# Patient Record
Sex: Male | Born: 1975 | Race: White | Hispanic: No | Marital: Married | Smoking: Never smoker
Health system: Southern US, Community
[De-identification: ages and names within clinical notes are randomized; demographics above are authoritative.]

## PROBLEM LIST (undated history)

## (undated) DIAGNOSIS — E7800 Pure hypercholesterolemia, unspecified: Secondary | ICD-10-CM

## (undated) DIAGNOSIS — M1990 Unspecified osteoarthritis, unspecified site: Secondary | ICD-10-CM

## (undated) DIAGNOSIS — G4730 Sleep apnea, unspecified: Secondary | ICD-10-CM

## (undated) DIAGNOSIS — N200 Calculus of kidney: Secondary | ICD-10-CM

---

## 1999-04-18 ENCOUNTER — Encounter: Payer: Self-pay | Admitting: Sports Medicine

## 1999-04-18 ENCOUNTER — Encounter: Admission: RE | Admit: 1999-04-18 | Discharge: 1999-04-18 | Payer: Self-pay | Admitting: Sports Medicine

## 2007-03-20 HISTORY — PX: KNEE SURGERY: SHX244

## 2007-11-13 ENCOUNTER — Emergency Department (HOSPITAL_BASED_OUTPATIENT_CLINIC_OR_DEPARTMENT_OTHER): Admission: EM | Admit: 2007-11-13 | Discharge: 2007-11-13 | Payer: Self-pay | Admitting: Emergency Medicine

## 2012-09-19 ENCOUNTER — Encounter (HOSPITAL_COMMUNITY): Payer: Self-pay | Admitting: Emergency Medicine

## 2012-09-19 ENCOUNTER — Emergency Department (HOSPITAL_COMMUNITY): Payer: BC Managed Care – PPO

## 2012-09-19 ENCOUNTER — Emergency Department (HOSPITAL_COMMUNITY)
Admission: EM | Admit: 2012-09-19 | Discharge: 2012-09-20 | Disposition: A | Discharge status: Home / Self Care | Payer: BC Managed Care – PPO | Attending: Emergency Medicine | Admitting: Emergency Medicine

## 2012-09-19 DIAGNOSIS — N200 Calculus of kidney: Secondary | ICD-10-CM | POA: Insufficient documentation

## 2012-09-19 DIAGNOSIS — E7800 Pure hypercholesterolemia, unspecified: Secondary | ICD-10-CM | POA: Insufficient documentation

## 2012-09-19 HISTORY — DX: Pure hypercholesterolemia, unspecified: E78.00

## 2012-09-19 LAB — COMPREHENSIVE METABOLIC PANEL
Alkaline Phosphatase: 89 U/L (ref 39–117)
BUN: 16 mg/dL (ref 6–23)
CO2: 25 mEq/L (ref 19–32)
Chloride: 100 mEq/L (ref 96–112)
GFR calc Af Amer: >90 mL/min (ref >90)
Glucose, Bld: 124 mg/dL — ABNORMAL HIGH (ref 70–99)
Potassium: 4.1 mEq/L (ref 3.5–5.1)
Total Bilirubin: 0.3 mg/dL (ref 0.3–1.2)

## 2012-09-19 LAB — CBC WITH DIFFERENTIAL/PLATELET
Hemoglobin: 15.9 g/dL (ref 13.0–17.0)
Lymphs Abs: 3.5 10*3/uL (ref 0.7–4.0)
MCH: 29.6 pg (ref 26.0–34.0)
Monocytes Relative: 9 % (ref 3–12)
Neutro Abs: 13 10*3/uL — ABNORMAL HIGH (ref 1.7–7.7)
Neutrophils Relative %: 71 % (ref 43–77)
RBC: 5.38 MIL/uL (ref 4.22–5.81)

## 2012-09-19 MED ORDER — ONDANSETRON HCL 4 MG/2ML IJ SOLN
4.0000 mg | Freq: Once | INTRAMUSCULAR | Status: AC
  Administered 2012-09-19: 4 mg via INTRAVENOUS
  Filled 2012-09-19: qty 2

## 2012-09-19 MED ORDER — HYDROMORPHONE HCL PF 1 MG/ML IJ SOLN
1.0000 mg | Freq: Once | INTRAMUSCULAR | Status: AC
  Administered 2012-09-19: 1 mg via INTRAVENOUS
  Filled 2012-09-19: qty 1

## 2012-09-19 MED ORDER — HYDROMORPHONE HCL PF 2 MG/ML IJ SOLN: 2.0000 mg | Freq: Once | INTRAMUSCULAR | Status: DC

## 2012-09-19 MED ORDER — TAMSULOSIN HCL 0.4 MG PO CAPS
0.8000 mg | ORAL_CAPSULE | Freq: Once | ORAL | Status: AC
  Administered 2012-09-20: 0.8 mg via ORAL
  Filled 2012-09-19: qty 2

## 2012-09-19 MED ORDER — SODIUM CHLORIDE 0.9 % IV SOLN
Freq: Once | INTRAVENOUS | Status: AC
Start: 2012-09-19 — End: 2012-09-19
  Administered 2012-09-19: 75 mL/h via INTRAVENOUS

## 2012-09-19 MED ORDER — KETOROLAC TROMETHAMINE 30 MG/ML IJ SOLN
30.0000 mg | Freq: Once | INTRAMUSCULAR | Status: AC
  Administered 2012-09-19: 30 mg via INTRAVENOUS
  Filled 2012-09-19: qty 1

## 2012-09-19 NOTE — ED Notes (Signed)
Patient transported to CT 

## 2012-09-19 NOTE — ED Notes (Addendum)
Pt reports having 10/10 right sided flank pain that began about two hours ago. Pt reports nausea, emesis, and is diaphoretic. Pt denies urinary frequency, dysuria, or hematuria. Pt denies any abdominal surgery.

## 2012-09-19 NOTE — ED Provider Notes (Signed)
   History    CSN: [id number] Arrival date & time 09/19/12  2212  First MD Initiated Contact with Patient 09/19/12 2220     Chief Complaint  Patient presents with  . Flank Pain   (Consider location/radiation/quality/duration/timing/severity/associated sxs/prior Treatment) The history is provided by the patient and the spouse. No language interpreter was used.    Dillon Taylor is a(n) 37 y.o. male who presents with cc R flank pain.Began acutely 2 hours ago. Sudden onset, radiates to the R testicle, sharp, intermittent, severe, associated nausea without vomiting.  Denies dysuria, suprapubic pain, frequency, urgency, or hematuria. Denies history of kidney stone.  He has no known family history.  He does have a past history of hypercholesterolemia patient is an obese white male.  Denies fevers, chills, myalgias, arthralgias. Denies DOE, SOB, chest tightness or pressure, radiation to left arm, jaw or back, or diaphoresis.  Denies headaches, light headedness, weakness, visual disturbances.     Past Medical History  Diagnosis Date  . Hypercholesteremia    Past Surgical History  Procedure Laterality Date  . Knee surgery     No family history on file. History  Substance Use Topics  . Smoking status: Never Smoker   . Smokeless tobacco: Never Used  . Alcohol Use: Yes     Comment: Socially     Review of Systems Ten systems reviewed and are negative for acute change, except as noted in the HPI.   Allergies  Review of patient's allergies indicates no known allergies.  Home Medications  No current outpatient prescriptions on file. BP 153/106  Pulse 93  Temp(Src) 98.5 F (36.9 C) (Oral)  Resp 24  Ht 5\' 11"  (1.803 m)  Wt 240 lb (108.863 kg)  BMI 33.49 kg/m2  SpO2 95% Physical Exam Physical Exam  Nursing note and vitals reviewed. Constitutional: Patient's skin is flushed.  He he is riding on the bed.  He appears extremely uncomfortable. HENT:  Head: Normocephalic and  atraumatic.  Eyes: Conjunctivae normal are normal. No scleral icterus.  Neck: Normal range of motion. Neck supple.  Cardiovascular: Normal rate, regular rhythm and normal heart sounds.   Pulmonary/Chest: Effort normal and breath sounds normal. No respiratory distress.  Abdominal: Soft.   CVA  tenderness right side no guarding no rebound.   Musculoskeletal: He exhibits no edema.  Neurological: He is alert.  Skin: Skin is warm and dry. Psychiatric: His behavior is normal.    ED Course  Procedures (including critical care time) Labs Reviewed  CBC WITH DIFFERENTIAL - Abnormal; Notable for the following:    WBC 18.4 (*)    Neutro Abs 13.0 (*)    Monocytes Absolute 1.6 (*)    All other components within normal limits  COMPREHENSIVE METABOLIC PANEL - Abnormal; Notable for the following:    Glucose, Bld 124 (*)    GFR calc non Af Amer 80 (*)    All other components within normal limits  URINALYSIS, ROUTINE W REFLEX MICROSCOPIC   No results found. 1. Nephrolithiasis     MDM   12:55 AM Patient has been given 4 mg dilaudid and 30 mg Toradol. Still in severe pain.  The patient's pain is still uncontrolled.  Urine is still pending and elevated white count.  I have given report to PA Pisciotta who will assume care of the patient. May have associated infection. Patient may need admission for pain control.  Arthor Captain, PA-C 09/20/12 1348  Arthor Captain, PA-C 09/24/12 2051

## 2012-09-20 LAB — URINALYSIS, ROUTINE W REFLEX MICROSCOPIC
Ketones, ur: NEGATIVE mg/dL
Leukocytes, UA: NEGATIVE
Nitrite: NEGATIVE
Protein, ur: NEGATIVE mg/dL

## 2012-09-20 MED ORDER — ONDANSETRON HCL 4 MG PO TABS: 4.0000 mg | ORAL_TABLET | Freq: Three times a day (TID) | ORAL | Status: DC | PRN

## 2012-09-20 MED ORDER — HYDROMORPHONE HCL PF 1 MG/ML IJ SOLN
1.0000 mg | Freq: Once | INTRAMUSCULAR | Status: AC
  Administered 2012-09-20: 1 mg via INTRAMUSCULAR
  Filled 2012-09-20: qty 1

## 2012-09-20 MED ORDER — TAMSULOSIN HCL 0.4 MG PO CAPS: 0.4000 mg | ORAL_CAPSULE | Freq: Every day | ORAL | Status: DC

## 2012-09-20 MED ORDER — OXYCODONE-ACETAMINOPHEN 5-325 MG PO TABS: ORAL_TABLET | ORAL | Status: DC

## 2012-09-20 MED ORDER — HYDROMORPHONE HCL PF 2 MG/ML IJ SOLN
2.0000 mg | Freq: Once | INTRAMUSCULAR | Status: AC
  Administered 2012-09-20: 2 mg via INTRAVENOUS
  Filled 2012-09-20: qty 1

## 2012-09-22 ENCOUNTER — Encounter (HOSPITAL_BASED_OUTPATIENT_CLINIC_OR_DEPARTMENT_OTHER): Payer: Self-pay | Admitting: Anesthesiology

## 2012-09-22 ENCOUNTER — Ambulatory Visit (HOSPITAL_BASED_OUTPATIENT_CLINIC_OR_DEPARTMENT_OTHER): Payer: BC Managed Care – PPO | Admitting: Anesthesiology

## 2012-09-22 ENCOUNTER — Ambulatory Visit (HOSPITAL_COMMUNITY): Payer: BC Managed Care – PPO

## 2012-09-22 ENCOUNTER — Encounter (HOSPITAL_BASED_OUTPATIENT_CLINIC_OR_DEPARTMENT_OTHER)
Admission: RE | Disposition: A | Discharge status: Home / Self Care | Payer: Self-pay | Source: Ambulatory Visit | Attending: Urology

## 2012-09-22 ENCOUNTER — Other Ambulatory Visit: Payer: Self-pay | Admitting: Urology

## 2012-09-22 ENCOUNTER — Ambulatory Visit (HOSPITAL_BASED_OUTPATIENT_CLINIC_OR_DEPARTMENT_OTHER)
Admission: RE | Admit: 2012-09-22 | Discharge: 2012-09-22 | Disposition: A | Discharge status: Home / Self Care | Payer: BC Managed Care – PPO | Source: Ambulatory Visit | Attending: Urology | Admitting: Urology

## 2012-09-22 DIAGNOSIS — N1330 Unspecified hydronephrosis: Secondary | ICD-10-CM | POA: Insufficient documentation

## 2012-09-22 DIAGNOSIS — R112 Nausea with vomiting, unspecified: Secondary | ICD-10-CM | POA: Insufficient documentation

## 2012-09-22 DIAGNOSIS — N35919 Unspecified urethral stricture, male, unspecified site: Secondary | ICD-10-CM | POA: Insufficient documentation

## 2012-09-22 DIAGNOSIS — N201 Calculus of ureter: Secondary | ICD-10-CM

## 2012-09-22 DIAGNOSIS — E7800 Pure hypercholesterolemia, unspecified: Secondary | ICD-10-CM | POA: Insufficient documentation

## 2012-09-22 DIAGNOSIS — Z79899 Other long term (current) drug therapy: Secondary | ICD-10-CM | POA: Insufficient documentation

## 2012-09-22 HISTORY — PX: CYSTOSCOPY WITH STENT PLACEMENT: SHX5790

## 2012-09-22 HISTORY — DX: Sleep apnea, unspecified: G47.30

## 2012-09-22 SURGERY — CYSTOSCOPY, WITH STENT INSERTION
Anesthesia: General | Site: Ureter | Laterality: Right | Wound class: Clean Contaminated

## 2012-09-22 MED ORDER — CEFAZOLIN SODIUM-DEXTROSE 2-3 GM-% IV SOLR
INTRAVENOUS | Status: DC | PRN
  Administered 2012-09-22: 2 g via INTRAVENOUS

## 2012-09-22 MED ORDER — CEPHALEXIN 500 MG PO CAPS: 500.0000 mg | ORAL_CAPSULE | Freq: Four times a day (QID) | ORAL | Status: DC

## 2012-09-22 MED ORDER — FENTANYL CITRATE 0.05 MG/ML IJ SOLN
25.0000 ug | INTRAMUSCULAR | Status: DC | PRN
  Filled 2012-09-22: qty 1

## 2012-09-22 MED ORDER — STERILE WATER FOR IRRIGATION IR SOLN
Status: DC | PRN
  Administered 2012-09-22: 3000 mL

## 2012-09-22 MED ORDER — PROPOFOL 10 MG/ML IV BOLUS
INTRAVENOUS | Status: DC | PRN
  Administered 2012-09-22: 300 mg via INTRAVENOUS

## 2012-09-22 MED ORDER — PHENAZOPYRIDINE HCL 100 MG PO TABS
100.0000 mg | ORAL_TABLET | Freq: Three times a day (TID) | ORAL | Status: AC
  Administered 2012-09-22: 100 mg via ORAL
  Filled 2012-09-22: qty 1

## 2012-09-22 MED ORDER — OXYCODONE-ACETAMINOPHEN 5-325 MG PO TABS
1.0000 | ORAL_TABLET | ORAL | Status: AC | PRN
  Administered 2012-09-22: 1 via ORAL
  Filled 2012-09-22: qty 1

## 2012-09-22 MED ORDER — LIDOCAINE HCL (CARDIAC) 20 MG/ML IV SOLN
INTRAVENOUS | Status: DC | PRN
  Administered 2012-09-22: 100 mg via INTRAVENOUS

## 2012-09-22 MED ORDER — SENNOSIDES-DOCUSATE SODIUM 8.6-50 MG PO TABS: 1.0000 | ORAL_TABLET | Freq: Two times a day (BID) | ORAL | Status: DC

## 2012-09-22 MED ORDER — PHENAZOPYRIDINE HCL 100 MG PO TABS: 100.0000 mg | ORAL_TABLET | Freq: Three times a day (TID) | ORAL | Status: DC | PRN

## 2012-09-22 MED ORDER — MIDAZOLAM HCL 5 MG/5ML IJ SOLN
INTRAMUSCULAR | Status: DC | PRN
  Administered 2012-09-22: 2 mg via INTRAVENOUS

## 2012-09-22 MED ORDER — ACETAMINOPHEN 10 MG/ML IV SOLN
INTRAVENOUS | Status: DC | PRN
  Administered 2012-09-22: 1000 mg via INTRAVENOUS

## 2012-09-22 MED ORDER — DEXAMETHASONE SODIUM PHOSPHATE 4 MG/ML IJ SOLN
INTRAMUSCULAR | Status: DC | PRN
  Administered 2012-09-22: 10 mg via INTRAVENOUS

## 2012-09-22 MED ORDER — ONDANSETRON HCL 4 MG/2ML IJ SOLN
INTRAMUSCULAR | Status: DC | PRN
  Administered 2012-09-22: 4 mg via INTRAVENOUS

## 2012-09-22 MED ORDER — FENTANYL CITRATE 0.05 MG/ML IJ SOLN
INTRAMUSCULAR | Status: DC | PRN
  Administered 2012-09-22 (×4): 50 ug via INTRAVENOUS

## 2012-09-22 MED ORDER — PROMETHAZINE HCL 25 MG/ML IJ SOLN
6.2500 mg | INTRAMUSCULAR | Status: DC | PRN
  Filled 2012-09-22: qty 1

## 2012-09-22 MED ORDER — HYOSCYAMINE SULFATE 0.125 MG PO TABS: 0.1250 mg | ORAL_TABLET | ORAL | Status: DC | PRN

## 2012-09-22 MED ORDER — LACTATED RINGERS IV SOLN
INTRAVENOUS | Status: DC
  Administered 2012-09-22: 11:00:00 via INTRAVENOUS
  Filled 2012-09-22: qty 1000

## 2012-09-22 MED ORDER — OXYCODONE-ACETAMINOPHEN 5-325 MG PO TABS: ORAL_TABLET | ORAL | Status: DC

## 2012-09-22 MED ORDER — OXYBUTYNIN CHLORIDE 5 MG PO TABS: 5.0000 mg | ORAL_TABLET | Freq: Four times a day (QID) | ORAL | Status: DC | PRN

## 2012-09-22 MED ORDER — KETOROLAC TROMETHAMINE 30 MG/ML IJ SOLN
15.0000 mg | Freq: Once | INTRAMUSCULAR | Status: DC | PRN
  Filled 2012-09-22: qty 1

## 2012-09-22 SURGICAL SUPPLY — 21 items
ADAPTER CATH URET PLST 4-6FR (CATHETERS) IMPLANT
ADPR CATH URET STRL DISP 4-6FR (CATHETERS)
BAG DRAIN URO-CYSTO SKYTR STRL (DRAIN) ×2 IMPLANT
BAG DRN UROCATH (DRAIN) ×1
CANISTER SUCT LVC 12 LTR MEDI- (MISCELLANEOUS) ×1 IMPLANT
CATH INTERMIT  6FR 70CM (CATHETERS) IMPLANT
CLOTH BEACON ORANGE TIMEOUT ST (SAFETY) ×2 IMPLANT
DRAPE CAMERA CLOSED 9X96 (DRAPES) ×2 IMPLANT
GLOVE BIO SURGEON STRL SZ7 (GLOVE) ×2 IMPLANT
GLOVE BIOGEL M 6.5 STRL (GLOVE) ×1 IMPLANT
GLOVE INDICATOR 7.5 STRL GRN (GLOVE) IMPLANT
GOWN PREVENTION PLUS LG XLONG (DISPOSABLE) ×2 IMPLANT
GOWN STRL NON-REIN LRG LVL3 (GOWN DISPOSABLE) ×1 IMPLANT
GOWN STRL REIN XL XLG (GOWN DISPOSABLE) ×2 IMPLANT
GUIDEWIRE 0.038 PTFE COATED (WIRE) IMPLANT
GUIDEWIRE ANG ZIPWIRE 038X150 (WIRE) IMPLANT
GUIDEWIRE STR DUAL SENSOR (WIRE) ×1 IMPLANT
IV NS IRRIG 3000ML ARTHROMATIC (IV SOLUTION) ×2 IMPLANT
NS IRRIG 500ML POUR BTL (IV SOLUTION) IMPLANT
PACK CYSTOSCOPY (CUSTOM PROCEDURE TRAY) ×2 IMPLANT
STENT CONTOUR 6FRX26X.035 (STENTS) ×1 IMPLANT

## 2012-09-22 NOTE — Transfer of Care (Signed)
Immediate Anesthesia Transfer of Care Note  Patient: Dillon Taylor  Procedure(s) Performed: Procedure(s) with comments: CYSTOSCOPY WITH STENT PLACEMENT (Right) - right ureteral stent placement   Patient Location: PACU  Anesthesia Type:General  Level of Consciousness: awake, alert  and oriented  Airway & Oxygen Therapy: Patient Spontanous Breathing and Patient connected to nasal cannula oxygen  Post-op Assessment: Report given to PACU RN  Post vital signs: Reviewed and stable  Complications: No apparent anesthesia complications

## 2012-09-22 NOTE — Anesthesia Procedure Notes (Signed)
Procedure Name: LMA Insertion Date/Time: 09/22/2012 12:04 PM Performed by: Maris Berger T Pre-anesthesia Checklist: Patient identified, Emergency Drugs available, Suction available and Patient being monitored Patient Re-evaluated:Patient Re-evaluated prior to inductionOxygen Delivery Method: Circle System Utilized Preoxygenation: Pre-oxygenation with 100% oxygen Intubation Type: IV induction Ventilation: Mask ventilation without difficulty LMA: LMA with gastric port inserted LMA Size: 5.0 Number of attempts: 1 Placement Confirmation: positive ETCO2 Dental Injury: Teeth and Oropharynx as per pre-operative assessment

## 2012-09-22 NOTE — H&P (Signed)
Urology History and Physical Exam  CC: Right ureter stone.  HPI:  37 year old no presents today for a ureter stone. This is located in the right side. This is his first stone episode. It began 09/19/12. It is associated with right flank pain. He also has nausea and emesis. He does not have fever. The pain is sharp in nature. It radiates to his ipsilateral groin. It is improved with oral medication and his nausea is improved with oral medication. His CT scan in the ER on 09/19/12. This revealed a right proximal ureter stone area it was located between the transverse process of L3 and L4. It was 4 mm in size. It was associated with mild proximal hydronephrosis. UA was negative for signs of infection on 09/20/12.   He had a KUB today. This revealed the stone remains in the same position between the L3 and L4 transverse process on the right. We discussed management options as listed below. As of his intractable nausea I recommend ureter stent placement today. He is interested in proceeding with shockwave lithotripsy in an interval fashion.  PMH: Past Medical History  Diagnosis Date  . Hypercholesteremia     PSH: Past Surgical History  Procedure Laterality Date  . Knee surgery      Allergies: No Known Allergies  Medications: Prescriptions prior to admission  Medication Sig Dispense Refill  . ibuprofen (ADVIL,MOTRIN) 200 MG tablet Take 800 mg by mouth every 8 (eight) hours as needed for pain.      . niacin-simvastatin (SIMCOR) 1000-20 MG 24 hr tablet Take 1 tablet by mouth at bedtime.      . ondansetron (ZOFRAN) 4 MG tablet Take 1 tablet (4 mg total) by mouth every 8 (eight) hours as needed for nausea.  10 tablet  0  . oxyCODONE-acetaminophen (PERCOCET/ROXICET) 5-325 MG per tablet 1 to 2 tabs PO q6hrs  PRN for pain  15 tablet  0  . tamsulosin (FLOMAX) 0.4 MG CAPS Take 1 capsule (0.4 mg total) by mouth daily. Take in the morning  10 capsule  0  . traMADol (ULTRAM) 50 MG tablet Take 100 mg by  mouth every 6 (six) hours as needed for pain.      Marland Kitchen zolpidem (AMBIEN) 10 MG tablet Take 10 mg by mouth at bedtime as needed for sleep.         Social History: History   Social History  . Marital Status: Married    Spouse Name: N/A    Number of Children: N/A  . Years of Education: N/A   Occupational History  . Not on file.   Social History Main Topics  . Smoking status: Never Smoker   . Smokeless tobacco: Never Used  . Alcohol Use: Yes     Comment: Socially   . Drug Use: No  . Sexually Active: Not on file   Other Topics Concern  . Not on file   Social History Narrative  . No narrative on file    Family History: No family history on file.  Review of Systems: Positive: Nausea, emesis, right flank pain. Negative: Fever, chest pain, or SOB.  A further 10 point review of systems was negative except what is listed in the HPI.  Physical Exam: Filed Vitals:   09/22/12 1024  BP: 132/85  Pulse: 79  Temp: 98.3 F (36.8 C)  Resp: 20    General: No acute distress.  Awake. Head:  Normocephalic.  Atraumatic. ENT:  EOMI.  Mucous membranes moist Neck:  Supple.  No lymphadenopathy. CV:  S1 present. S2 present. Regular rate. Pulmonary: Equal effort bilaterally.  Clear to auscultation bilaterally. Abdomen: Soft.  Mildly tender to palpation RLQ. Skin:  Normal turgor.  No visible rash. Extremity: No gross deformity of bilateral upper extremities.  No gross deformity of    bilateral lower extremities. Neurologic: Alert. Appropriate mood.    Studies:  Recent Labs     09/19/12  2242  HGB  15.9  WBC  18.4*  PLT  330    Recent Labs     09/19/12  2242  NA  138  K  4.1  CL  100  CO2  25  BUN  16  CREATININE  1.15  CALCIUM  9.9  GFRNONAA  80*  GFRAA  >90     No results found for this basename: PT, INR, APTT,  in the last 72 hours   No components found with this basename: ABG,     Assessment:  Right ureter stone.  Plan: To OR for cystoscopy and right  ureter stent placement.

## 2012-09-22 NOTE — Anesthesia Preprocedure Evaluation (Signed)
Anesthesia Evaluation  Patient identified by MRN, date of birth, ID band Patient awake    Reviewed: Allergy & Precautions, H&P , NPO status , Patient's Chart, lab work & pertinent test results  Airway Mallampati: II  TM Distance: <3 FB Neck ROM: Full    Dental no notable dental hx.    Pulmonary neg pulmonary ROS,  breath sounds clear to auscultation  Pulmonary exam normal       Cardiovascular negative cardio ROS  Rhythm:Regular Rate:Normal     Neuro/Psych negative neurological ROS  negative psych ROS   GI/Hepatic negative GI ROS, Neg liver ROS,   Endo/Other  Morbid obesity  Renal/GU negative Renal ROS  negative genitourinary   Musculoskeletal negative musculoskeletal ROS (+)   Abdominal   Peds negative pediatric ROS (+)  Hematology negative hematology ROS (+)   Anesthesia Other Findings   Reproductive/Obstetrics negative OB ROS                             Anesthesia Physical Anesthesia Plan  ASA: II  Anesthesia Plan: General   Post-op Pain Management:    Induction: Intravenous  Airway Management Planned: LMA  Additional Equipment:   Intra-op Plan:   Post-operative Plan:   Informed Consent: I have reviewed the patients History and Physical, chart, labs and discussed the procedure including the risks, benefits and alternatives for the proposed anesthesia with the patient or authorized representative who has indicated his/her understanding and acceptance.   Dental advisory given  Plan Discussed with: CRNA and Surgeon  Anesthesia Plan Comments:         Anesthesia Quick Evaluation  

## 2012-09-22 NOTE — Op Note (Signed)
Urology Operative Report  Date of Procedure: 09/22/12  Surgeon: Natalia Leatherwood, MD Assistant:  None  Preoperative Diagnosis: Right ureter stone. Postoperative Diagnosis: Right ureter stone. Meatal stenosis.  Procedure(s): Meatal dilation Cystoscopy Right ureter stent placement (6 x 26) Fluoroscopy with interpretation  Estimated blood loss: Minimal  Specimen: Urine sent for culture  Drains: None  Complications: None  Findings: Large amount of debris return after placement of right ureter stent.  Specimen of urine sent for culture from the operating room.  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  Right mid proximal ureter stone visible on fluoroscopy.  History of present illness: 37 year old male presented to clinic today with intractable nausea and right flank pain due to a right ureter stone. This was found on CT scan in the ER. Plain film today revealed the stone remained unchanged in position. Due to his intractable modular he elected to have a right ureter stent placed. He plans on proceeding with interval operative lithotripsy for management of the stone.   Procedure in detail: After informed consent was obtained, the patient was taken to the operating room. They were placed in the supine position. SCDs were turned on and in place. IV antibiotics were infused, and general anesthesia was induced. A timeout was performed in which the correct patient, surgical site, and procedure were identified and agreed upon by the team.  The patient was placed in a dorsolithotomy position, making sure to pad all pertinent neurovascular pressure points. The genitals were prepped and draped in the usual sterile fashion.  A rigid cystoscope was attempted to be placed into the urethra, but there was stenosis encountered. I then used male urethral sounds to dilate the stenosis starting at 83 [place] and going up to 24 [place]. After this was done I was able to  successfully place the cystoscope. I did notice that he had a large gland of Littre at the distal meatus which may have contributed to the stenotic area. After this the cystoscope was advanced through the urethra and into the bladder. The bladder was then drained. It was fully distended and evaluated in a systematic fashion to visualize the entire surface of the bladder. There were no bladder tumors noted. Attention was turned to the right ureter orifice. Fluoroscopy was performed and the right ureter stone was visualized between the level of L3 and L4. A sensor wire was placed up the right ureter orifice into the right renal pelvis on fluoroscopy. A 6 x 26 double-J ureter stent was placed over the wire under fluoroscopy and deployed with a good curl noted in the right renal pelvis and a curl in the bladder. The tethering strings were removed. There was noted to be a large amount of debris coming from the stent after placement and urine was sent for culture from this debris.  This completed the procedure. He was placed in a supine position, anesthesia was reversed, and he was taken to the [facility] in stable condition.  All counts were correct at the end of the case.  He will be treated with Keflex for the next several days due to manipulation of his urinary tract in addition to the IV and box received today. We will proceed as planned to schedule shockwave lithotripsy.

## 2012-09-23 ENCOUNTER — Other Ambulatory Visit: Payer: Self-pay | Admitting: Urology

## 2012-09-24 ENCOUNTER — Encounter (HOSPITAL_BASED_OUTPATIENT_CLINIC_OR_DEPARTMENT_OTHER): Payer: Self-pay | Admitting: Urology

## 2012-09-24 NOTE — Anesthesia Postprocedure Evaluation (Signed)
Anesthesia Post Note  Patient: Dillon Taylor  Procedure(s) Performed: Procedure(s) (LRB): CYSTOSCOPY WITH STENT PLACEMENT (Right)  Anesthesia type: General  Patient location: PACU  Post pain: Pain level controlled  Post assessment: Post-op Vital signs reviewed  Last Vitals:  Filed Vitals:   09/22/12 1357  BP: 140/90  Pulse: 79  Temp: 36.6 C  Resp: 8    Post vital signs: Reviewed  Level of consciousness: sedated  Complications: No apparent anesthesia complications

## 2012-10-02 NOTE — ED Provider Notes (Signed)
Medical screening examination/treatment/procedure(s) were performed by non-physician practitioner and as supervising physician I was immediately available for consultation/collaboration.  Jasmine Awe, MD 10/02/12 2307

## 2012-10-03 ENCOUNTER — Encounter (HOSPITAL_COMMUNITY): Payer: Self-pay | Admitting: *Deleted

## 2012-10-07 ENCOUNTER — Encounter (HOSPITAL_COMMUNITY): Payer: Self-pay | Admitting: Pharmacy Technician

## 2012-10-09 ENCOUNTER — Encounter (HOSPITAL_COMMUNITY): Payer: Self-pay | Admitting: *Deleted

## 2012-10-09 ENCOUNTER — Encounter (HOSPITAL_COMMUNITY)
Admission: RE | Disposition: A | Discharge status: Home / Self Care | Payer: Self-pay | Source: Ambulatory Visit | Attending: Urology

## 2012-10-09 ENCOUNTER — Ambulatory Visit (HOSPITAL_COMMUNITY)
Admission: RE | Admit: 2012-10-09 | Discharge: 2012-10-09 | Disposition: A | Discharge status: Home / Self Care | Payer: BC Managed Care – PPO | Source: Ambulatory Visit | Attending: Urology | Admitting: Urology

## 2012-10-09 ENCOUNTER — Ambulatory Visit (HOSPITAL_COMMUNITY): Payer: BC Managed Care – PPO

## 2012-10-09 DIAGNOSIS — N201 Calculus of ureter: Secondary | ICD-10-CM | POA: Insufficient documentation

## 2012-10-09 DIAGNOSIS — E7800 Pure hypercholesterolemia, unspecified: Secondary | ICD-10-CM | POA: Insufficient documentation

## 2012-10-09 DIAGNOSIS — I498 Other specified cardiac arrhythmias: Secondary | ICD-10-CM | POA: Insufficient documentation

## 2012-10-09 DIAGNOSIS — G4730 Sleep apnea, unspecified: Secondary | ICD-10-CM | POA: Insufficient documentation

## 2012-10-09 HISTORY — DX: Calculus of kidney: N20.0

## 2012-10-09 HISTORY — DX: Unspecified osteoarthritis, unspecified site: M19.90

## 2012-10-09 SURGERY — LITHOTRIPSY, ESWL
Anesthesia: LOCAL | Laterality: Right

## 2012-10-09 MED ORDER — DIAZEPAM 5 MG PO TABS
10.0000 mg | ORAL_TABLET | ORAL | Status: AC
  Administered 2012-10-09: 10 mg via ORAL
  Filled 2012-10-09: qty 1

## 2012-10-09 MED ORDER — OXYCODONE-ACETAMINOPHEN 5-325 MG PO TABS
1.0000 | ORAL_TABLET | ORAL | Status: DC | PRN
  Administered 2012-10-09: 1 via ORAL
  Filled 2012-10-09: qty 1

## 2012-10-09 MED ORDER — OXYCODONE-ACETAMINOPHEN 5-325 MG PO TABS: 1.0000 | ORAL_TABLET | ORAL | Status: DC | PRN

## 2012-10-09 MED ORDER — DEXTROSE-NACL 5-0.45 % IV SOLN
INTRAVENOUS | Status: DC
  Administered 2012-10-09: 08:00:00 via INTRAVENOUS

## 2012-10-09 MED ORDER — CIPROFLOXACIN HCL 500 MG PO TABS
500.0000 mg | ORAL_TABLET | ORAL | Status: AC
  Administered 2012-10-09: 500 mg via ORAL
  Filled 2012-10-09: qty 1

## 2012-10-09 MED ORDER — DIPHENHYDRAMINE HCL 25 MG PO CAPS
25.0000 mg | ORAL_CAPSULE | ORAL | Status: AC
  Administered 2012-10-09: 25 mg via ORAL
  Filled 2012-10-09: qty 1

## 2012-10-09 MED ORDER — MORPHINE SULFATE 10 MG/ML IJ SOLN: 2.0000 mg | INTRAMUSCULAR | Status: DC | PRN

## 2012-10-09 NOTE — H&P (Signed)
Urology History and Physical Exam  CC: Right ureter stone  HPI:  37 year old male presents today for a right ureter stone. He is scheduled for right sided shock wave lithotripsy. He presented to the clinic 09/22/12 with flank pain. He previously had a CT scan in the ER on 09/19/12 which revealed a right proximal ureter stone. This was located between the transverse process of L3 and L4. It was 4 mm in size. It was visible on KUB. He had a right ureter stent placed on 09/22/12 for intractable pain. UA that day was negative for signs of infection. He followed up after stent placement on 09/29/12 for stent pain where KUB revealed the stent was in good position. He was not having fever and his UA revealed microhematuria with only 0-2 WBCs. We've discuss management options including the risks, benefits, alternatives, and likelihood of achieving goals. He presents today for shockwave lithotripsy of his right ureter stone.  PMH: Past Medical History  Diagnosis Date  . Hypercholesteremia   . Sleep apnea   . Renal stone   . Arthritis     left knee    PSH: Past Surgical History  Procedure Laterality Date  . Knee surgery  2009    X6 Right knee surgery  . Cystoscopy with stent placement Right 09/22/2012    Procedure: CYSTOSCOPY WITH STENT PLACEMENT;  Surgeon: Milford Cage, MD;  Location: [facility];  Service: Urology;  Laterality: Right;  right ureteral stent placement     Allergies: No Known Allergies  Medications: No prescriptions prior to admission     Social History: History   Social History  . Marital Status: Married    Spouse Name: N/A    Number of Children: N/A  . Years of Education: N/A   Occupational History  . Not on file.   Social History Main Topics  . Smoking status: Never Smoker   . Smokeless tobacco: Never Used  . Alcohol Use: Yes     Comment: Socially   . Drug Use: No  . Sexually Active: Yes   Other Topics Concern  . Not on file   Social  History Narrative  . No narrative on file    Family History: History reviewed. No pertinent family history.  Review of Systems: Positive: Right flank pain. Negative: Fever, SOB, or chest pain.  A further 10 point review of systems was negative except what is listed in the HPI.  Physical Exam: There were no vitals filed for this visit.  General: No acute distress.  Awake. Head:  Normocephalic.  Atraumatic. ENT:  EOMI.  Mucous membranes moist Neck:  Supple.  No lymphadenopathy. CV:  S1 present. S2 present. Regular rate. Pulmonary: Equal effort bilaterally.  Clear to auscultation bilaterally. Abdomen: Soft.  Non- tender to palpation. Skin:  Normal turgor.  No visible rash. Extremity: No gross deformity of bilateral upper extremities.  No gross deformity of    bilateral lower extremities. Neurologic: Alert. Appropriate mood.    Studies:  No results found for this basename: HGB, WBC, PLT,  in the last 72 hours  No results found for this basename: NA, K, CL, CO2, BUN, CREATININE, CALCIUM, MAGNESIUM, GFRNONAA, GFRAA,  in the last 72 hours   No results found for this basename: PT, INR, APTT,  in the last 72 hours   No components found with this basename: ABG,     Assessment:  Right ureter stone.  Plan: Proceed with right ureter stone shock wave lithotripsy.

## 2012-10-09 NOTE — Op Note (Signed)
Urology Operative Report  Date of Procedure: 10/09/12  Surgeon: Natalia Leatherwood, MD Assistant:  None  Preoperative Diagnosis: Right ureter stone Postoperative Diagnosis:   Same  Procedure(s): Right ureter stone shockwave lithotripsy  Estimated blood loss: None  Specimen: None  Drains: None  Complications: None  Findings: Required gating due to brief arrhythmia.  Stone visible on fluoroscopy.   History of present illness: Patient had a right ureter stent placed for symptomatic right ureter stone. He presents today for SWL of that stone.  Pre-procedure films were reviewed to identify the stone.   Procedure in detail: After informed consent was obtained, the patient was taken to the SWL truck. They were placed in the supine position. Correct surgical site was marked. IV antibiotics were infused, and IV sedation was induced. A timeout was performed in which the correct patient, surgical site, and procedure were identified and agreed upon by the team.  The machine was coupled to the patient and fluoroscopy was used to align the stone. Lithotripsy was carried out in the standard fashion by starting at a low intensity level of shocks and progressing up to a level 4. A total of 4000 shocks was used while monitoring the stone to ensure it remained in good positon.  The stone appeared to break up on fluoroscopy during the treatment.  We used a Art therapist.  After all the planned shocks were delivered, the patient was awakened and taken to the recovery area in a stable position.

## 2014-09-16 IMAGING — CR DG ABDOMEN 1V
2 series · 2 of 2 positions shown · non-contrast
Comparison: 09/29/2012

CLINICAL DATA: Right-sided flank pain, pre lithotripsy evaluation

ABDOMEN - 1 VIEW

[t abdomen supine * (1 of 2)]
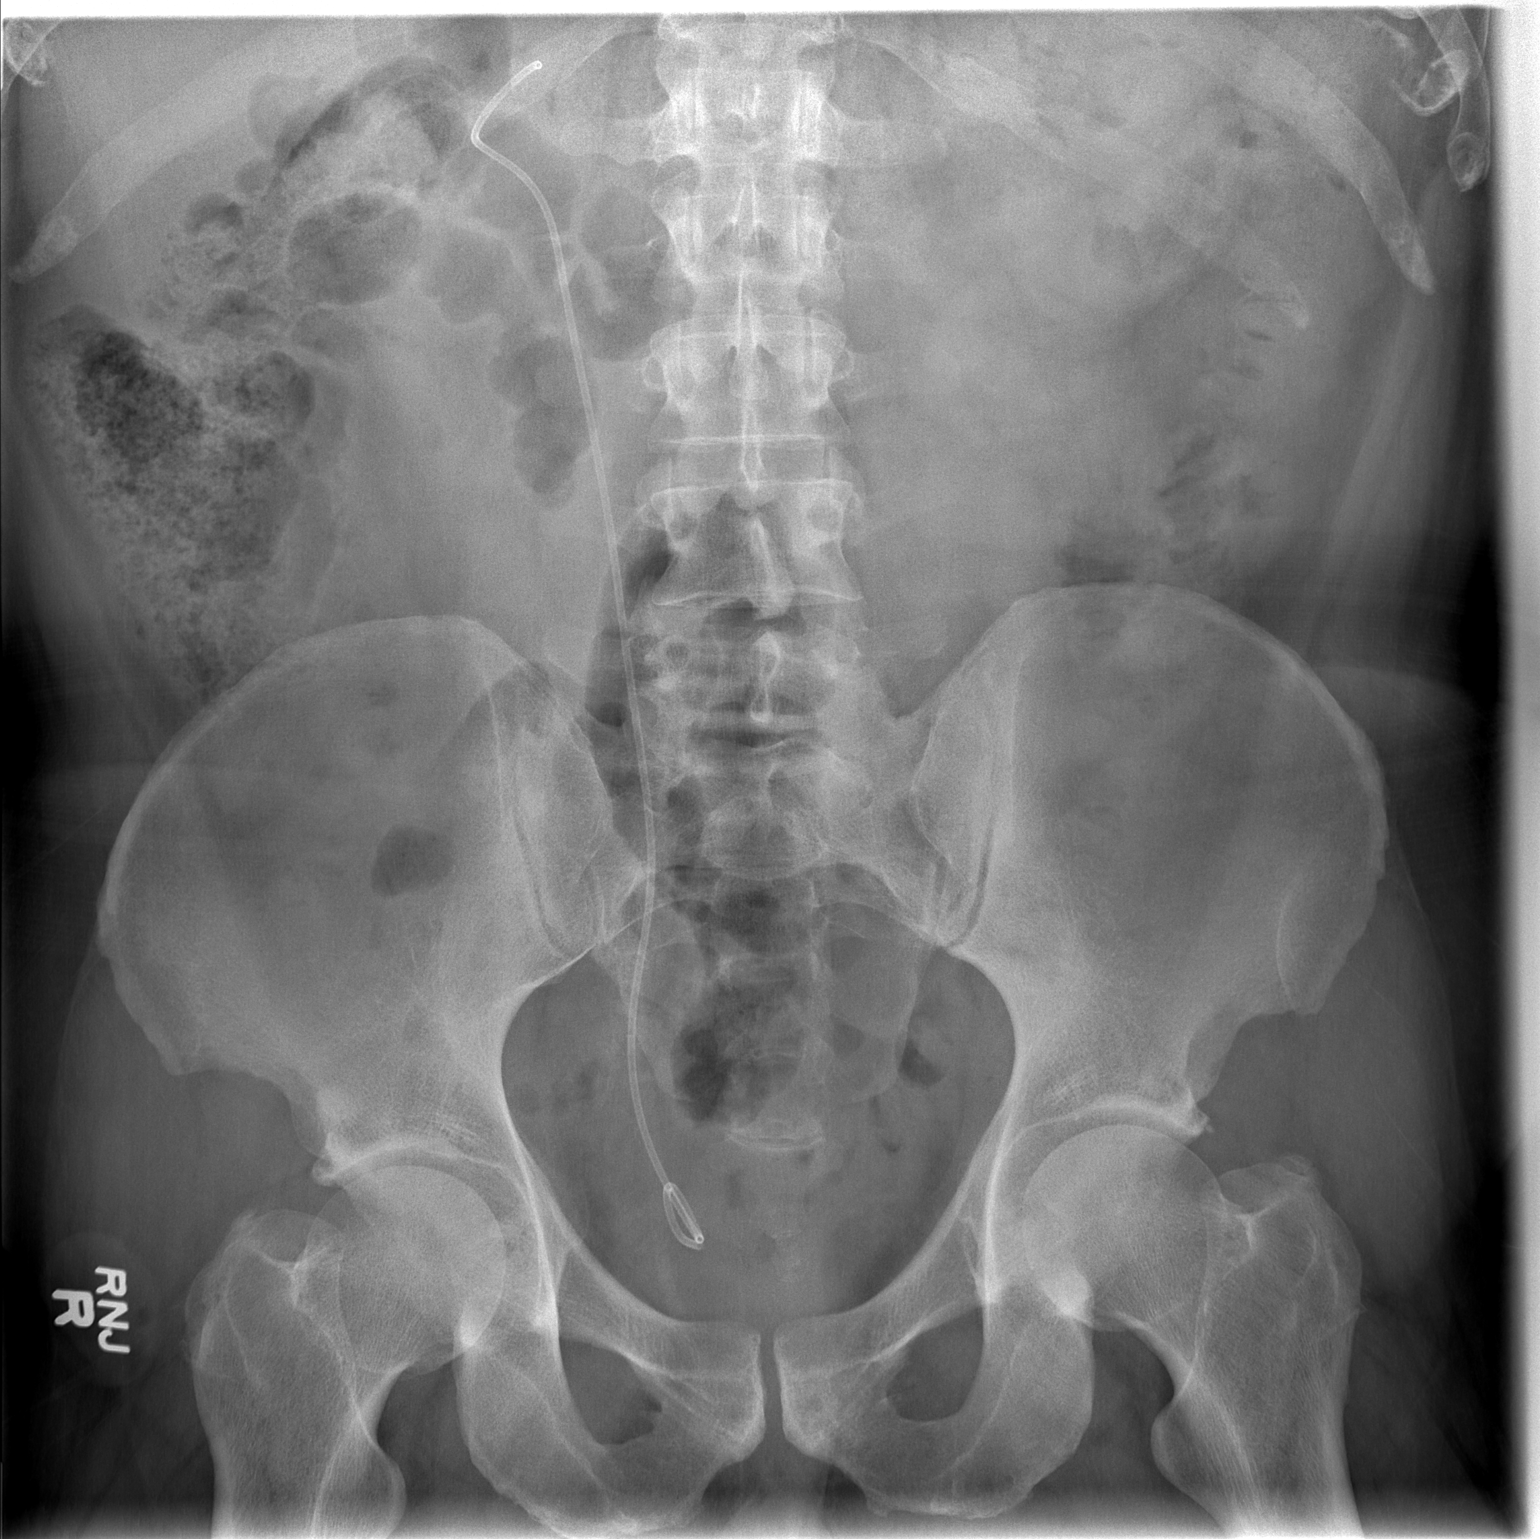

[t abdomen supine * (2 of 2)]
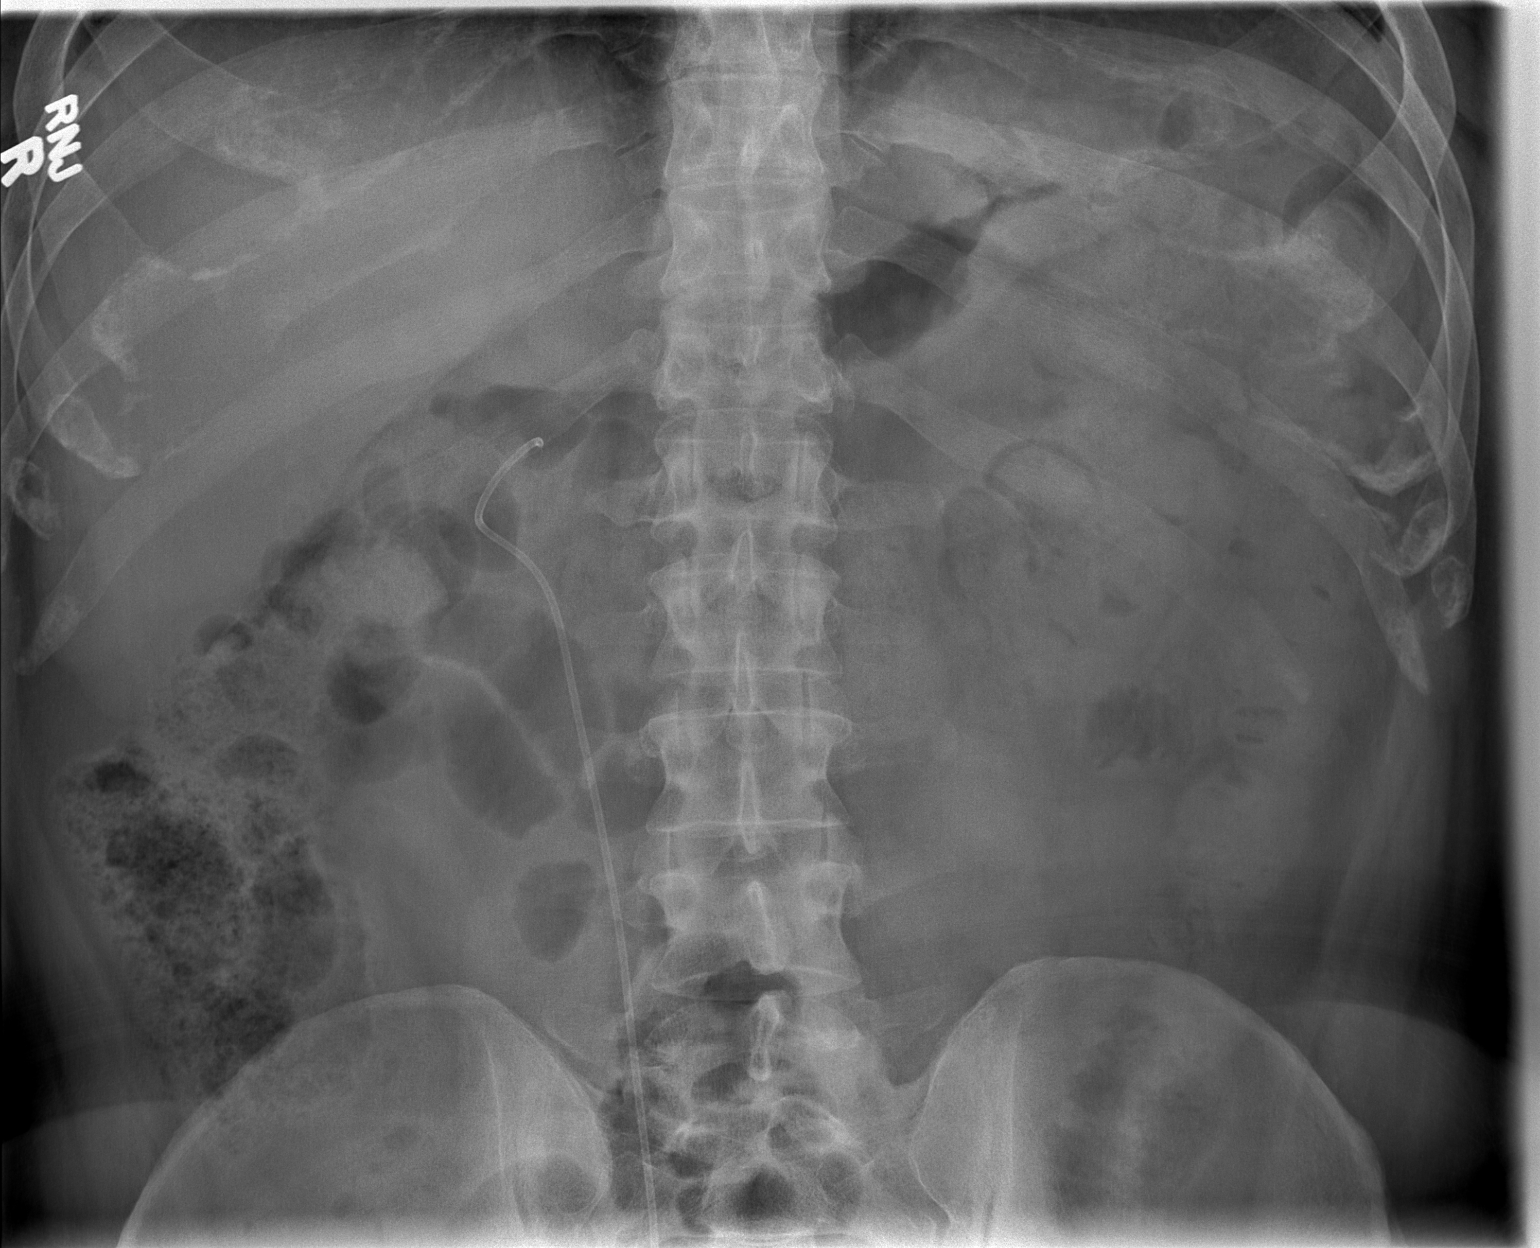

[2 of 2 positions shown; findings below may reference images not displayed]

FINDINGS: The right-sided ureteral stent is again identified.  The
calculus noted along the more proximal aspect of the right ureteral
stent is less well visualized on this examination.  The osseous
structures are within normal limits.  The overlying bowel gas
pattern is unremarkable.
IMPRESSION: Right ureteral stent in satisfactory position.  No other focal
abnormality is noted at this time.

## 2015-10-18 ENCOUNTER — Institutional Professional Consult (permissible substitution): Payer: BC Managed Care – PPO | Admitting: Neurology

## 2015-11-09 ENCOUNTER — Ambulatory Visit (INDEPENDENT_AMBULATORY_CARE_PROVIDER_SITE_OTHER): Payer: BC Managed Care – PPO | Admitting: Neurology

## 2015-11-09 ENCOUNTER — Encounter: Payer: Self-pay | Admitting: Neurology

## 2015-11-09 VITALS — BP 138/88 | HR 82 | Resp 18 | Ht 71.0 in | Wt 253.0 lb

## 2015-11-09 DIAGNOSIS — G479 Sleep disorder, unspecified: Secondary | ICD-10-CM | POA: Diagnosis not present

## 2015-11-09 DIAGNOSIS — E669 Obesity, unspecified: Secondary | ICD-10-CM

## 2015-11-09 DIAGNOSIS — G478 Other sleep disorders: Secondary | ICD-10-CM | POA: Diagnosis not present

## 2015-11-09 DIAGNOSIS — Z9989 Dependence on other enabling machines and devices: Principal | ICD-10-CM

## 2015-11-09 DIAGNOSIS — G4733 Obstructive sleep apnea (adult) (pediatric): Secondary | ICD-10-CM | POA: Diagnosis not present

## 2015-11-09 NOTE — Progress Notes (Signed)
Subjective:    Patient ID: Dillon Taylor is a 40 y.o. male.  HPI     Star Age, MD, PhD [facility] [address] P.O. [place], [place] [address]  Dear Nicki Reaper,   I saw your patient, Dillon Taylor, upon your kind request in my neurologic clinic today for initial consultation of his sleep disorder, in particular, concern for underlying obstructive sleep apnea and sleep maintenance difficulties. The patient is unaccompanied today. As you know, [name] is a 40 year old right-handed gentleman with an underlying medical history of hyperlipidemia, kidney stone, arthritis and obesity, who was previously diagnosed with obstructive sleep apnea many years ago and placed on AutoPap therapy from what I understand. He does not recall where he had his sleep study and it was probably more than 8 years ago, machine is maybe 68+ years old.  He reports residual sleep maintenance issues and daytime somnolence. His Epworth sleepiness score is 1 out of 24 today, his fatigue score is 26 out of 63. He goes to sleep okay but has difficulty with sleep disruption, current medication includes temazepam 30 mg daily at night, tramadol as needed, zolpidem at night 10 mg strength. He is no longer on narcotic pain medication, which was for a kidney stone.  He works in Engineer, technical sales. He lives with his wife and his 32 yo twins (boy and girl). He denies RLS symptoms and has no PLMs.  I reviewed your office note from 08/17/2015, which you kindly included. I reviewed his AutoPap compliance data from 10/10/2015 through 11/08/2015 which is a total of 30 days during which time he used his machine 23 days with percent used days greater than 4 hours at 70%, indicating adequate but not great compliance, with an average usage of 5 hours and 25 minutes, residual AHI 1.6 per hour, 95th percentile pressure at 14.4, leak high with the 95th percentile at 37.9 L/m, pressure range of 4-20 cm. Does not recall any FHx  of OSA, does not know FHx. He has had a long-standing history of difficulty with his sleep even in his teenage years. For years, a combination of temazepam and generic Ambien helped but it is not as helpful now. He denies any significant stress, he denies mood disorders, drinks alcohol infrequently, maybe once a month, is a nonsmoker and has eliminated caffeine on a day-to-day basis, in the hope that he would sleep better. He has tried to improve his sleep hygiene as best as he can. He has never seen a psychiatrist or psychologist for insomnia. Bedtime is around 11 PM, wakeup time between 6 and 6:30 AM, he does not typically wake up well rested.  His past Medical History Is Significant For: Past Medical History:  Diagnosis Date  . Arthritis    left knee  . Hypercholesteremia   . Renal stone   . Sleep apnea     His Past Surgical History Is Significant For: Past Surgical History:  Procedure Laterality Date  . CYSTOSCOPY WITH STENT PLACEMENT Right 09/22/2012   Procedure: CYSTOSCOPY WITH STENT PLACEMENT;  Surgeon: Molli Hazard, MD;  Location: [facility];  Service: Urology;  Laterality: Right;  right ureteral stent placement   . KNEE SURGERY  2009   X6 Right knee surgery    His Family History Is Significant For: No family history on file.  His Social History Is Significant For: Social History   Social History  . Marital status: Married    Spouse name: N/A  .  Number of children: 2  . Years of education: college   Social History Main Topics  . Smoking status: Never Smoker  . Smokeless tobacco: Never Used  . Alcohol use Yes     Comment: Socially   . Drug use: No  . Sexual activity: Yes   Other Topics Concern  . None   Social History Narrative   Drinks about 1 caffeine drink a week     His Allergies Are:  No Known Allergies:   His Current Medications Are:  Outpatient Encounter Prescriptions as of 11/09/2015  Medication Sig  . niacin (NIASPAN) 1000  MG CR tablet   . simvastatin (ZOCOR) 20 MG tablet   . temazepam (RESTORIL) 30 MG capsule Take 30 mg by mouth at bedtime.  . traMADol (ULTRAM) 50 MG tablet   . zolpidem (AMBIEN) 10 MG tablet Take 10 mg by mouth at bedtime as needed for sleep.  . [DISCONTINUED] hyoscyamine (LEVSIN, ANASPAZ) 0.125 MG tablet Take 1 tablet (0.125 mg total) by mouth every 4 (four) hours as needed for cramping (bladder spasms).  . [DISCONTINUED] niacin-simvastatin (SIMCOR) 1000-20 MG 24 hr tablet Take 1 tablet by mouth at bedtime.  . [DISCONTINUED] ondansetron (ZOFRAN) 4 MG tablet Take 1 tablet (4 mg total) by mouth every 8 (eight) hours as needed for nausea.  . [DISCONTINUED] oxybutynin (DITROPAN) 5 MG tablet Take 1 tablet (5 mg total) by mouth every 6 (six) hours as needed (Bladder spasms.).  . [DISCONTINUED] oxyCODONE-acetaminophen (PERCOCET/ROXICET) 5-325 MG per tablet Take 1-2 tablets by mouth every 4 (four) hours as needed for pain.  . [DISCONTINUED] phenazopyridine (PYRIDIUM) 100 MG tablet Take 1 tablet (100 mg total) by mouth every 8 (eight) hours as needed for pain (Burning urination.  Will turn urine and body fluids orange.).   No facility-administered encounter medications on file as of 11/09/2015.   :  Review of Systems:  Out of a complete 14 point review of systems, all are reviewed and negative with the exception of these symptoms as listed below: Review of Systems  Neurological:       Patient had sleep study about 8+ years ago. He does not remember where. He was prescribed CPAP and is still using. Has trouble falling and staying asleep, snoring, witnessed apnea, wakes up feeling tired, daytime tiredness, denies taking naps.   Epworth Sleepiness Scale 0= would never doze 1= slight chance of dozing 2= moderate chance of dozing 3= high chance of dozing  Sitting and reading:0 Watching TV:0 Sitting inactive in a public place (ex. Theater or meeting):0 As a passenger in a car for an hour without a  break:0 Lying down to rest in the afternoon:1 Sitting and talking to someone:0 Sitting quietly after lunch (no alcohol):0 In a car, while stopped in traffic:0 Total:1   Objective:  Neurologic Exam  Physical Exam Physical Examination:   Vitals:   11/09/15 1127  BP: 138/88  Pulse: 82  Resp: 18    General Examination: The patient is a very pleasant 40 y.o. male in no acute distress. He appears well-developed and well-nourished and well groomed.   HEENT: Normocephalic, atraumatic, pupils are equal, round and reactive to light and accommodation. Funduscopic exam is normal with sharp disc margins noted. Extraocular tracking is good without limitation to gaze excursion or nystagmus noted. Normal smooth pursuit is noted. Hearing is grossly intact. Face is symmetric with normal facial animation and normal facial sensation. Speech is clear with no dysarthria noted. There is no hypophonia. There is no lip,  neck/head, jaw or voice tremor. Neck is supple with full range of passive and active motion. There are no carotid bruits on auscultation. Oropharynx exam reveals: mild mouth dryness, good dental hygiene and marked airway crowding, due to larger uvula and thicker soft palate, and larger tongue, tonsils in place. Mallampati is class III. Tongue protrudes centrally and palate elevates symmetrically. Tonsils are not fully visualized. Neck size is 19 5/8 inches. He has a mild overbite.   Chest: Clear to auscultation without wheezing, rhonchi or crackles noted.  Heart: S1+S2+0, regular and normal without murmurs, rubs or gallops noted.   Abdomen: Soft, non-tender and non-distended with normal bowel sounds appreciated on auscultation.  Extremities: There is no pitting edema in the distal lower extremities bilaterally. Pedal pulses are intact.  Skin: Warm and dry without trophic changes noted. There are no varicose veins.  Musculoskeletal: exam reveals no obvious joint deformities, tenderness or  joint swelling or erythema, except L knee pain and R ankle.   Neurologically:  Mental status: The patient is awake, alert and oriented in all 4 spheres. His immediate and remote memory, attention, language skills and fund of knowledge are appropriate. There is no evidence of aphasia, agnosia, apraxia or anomia. Speech is clear with normal prosody and enunciation. Thought process is linear. Mood is normal and affect is normal.  Cranial nerves II - XII are as described above under HEENT exam. In addition: shoulder shrug is normal with equal shoulder height noted. Motor exam: Normal bulk, strength and tone is noted. There is no drift, tremor or rebound. Romberg is negative. Reflexes are 2+ throughout, L knee not tested per his request due to soreness. Fine motor skills and coordination: intact with normal finger taps, normal hand movements, normal rapid alternating patting, normal foot taps and normal foot agility.  Cerebellar testing: No dysmetria or intention tremor on finger to nose testing. Heel to shin is unremarkable bilaterally. There is no truncal or gait ataxia.  Sensory exam: intact in the UEs and LEs.  Gait, station and balance: He stands easily. No veering to one side is noted. No leaning to one side is noted. Posture is age-appropriate and stance is narrow based. Gait shows normal stride length and normal tums noted. Tandem walk is unremarkable.  Assessment and Plan:   In summary, BAYLEY HURN is a very pleasant 40 y.o.-year old male with an underlying medical history of hyperlipidemia, kidney stone, arthritis and obesity, who was previously diagnosed with obstructive sleep apnea and placed on AutoPap therapy. He tries to be compliant with treatment and is commended for this. He would benefit from reevaluation because of suboptimal treatment and no evaluation of his sleep apnea in many years, would benefit from perhaps switching from AutoPap to CPAP, would benefit from getting a new machine,  new her generation and updated supplies. He uses a fullface mask for mouth breathing. He has a long-standing history of sleep maintenance issues and unfortunately there may not be a whole lot we can do other than trying to optimize his sleep apnea. He is advised he can bring his medication for sleep for his sleep study as well, and next step for chronic insomnia may be to see a psychiatrist or psychologist, he is encouraged to discuss this with you as well.  I had a long chat with the patient about my findings and the diagnosis of OSA, its prognosis and treatment options. We talked about medical treatments, surgical interventions and non-pharmacological approaches. I explained in particular the risks  and ramifications of untreated moderate to severe OSA, especially with respect to developing cardiovascular disease down the Road, including congestive heart failure, difficult to treat hypertension, cardiac arrhythmias, or stroke. Even type 2 diabetes has, in part, been linked to untreated OSA. Symptoms of untreated OSA include daytime sleepiness, memory problems, mood irritability and mood disorder such as depression and anxiety, lack of energy, as well as recurrent headaches, especially morning headaches. We talked about trying to maintain a healthy lifestyle in general, as well as the importance of weight control. I encouraged the patient to eat healthy, exercise daily and keep well hydrated, to keep a scheduled bedtime and wake time routine, to not skip any meals and eat healthy snacks in between meals. I advised the patient not to drive when feeling sleepy. I recommended the following at this time: sleep study with potential positive airway pressure titration. (We will score hypopneas at 3% and split the sleep study into diagnostic and treatment portion, if the estimated. 2 hour AHI is >15/h).   I explained the sleep test procedure to the patient and also outlined possible surgical and non-surgical treatment  options of OSA, including the use of a custom-made dental device (which would require a referral to a specialist dentist or oral surgeon), upper airway surgical options, such as pillar implants, radiofrequency surgery, tongue base surgery, and UPPP (which would involve a referral to an ENT surgeon). Rarely, jaw surgery such as mandibular advancement may be considered.  I also explained the CPAP treatment option to the patient, who indicated that he would be willing to switch to CPAP if the need arises. I explained the importance of being compliant with PAP treatment, not only for insurance purposes but primarily to improve His symptoms, and for the patient's long term health benefit, including to reduce His cardiovascular risks. I answered all his questions today and the patient was in agreement. I would like to see him back after the sleep study is completed and encouraged him to call with any interim questions, concerns, problems or updates.   Thank you very much for allowing me to participate in the care of this nice patient. If I can be of any further assistance to you please do not hesitate to call me at [phone].  Sincerely,   Star Age, MD, PhD

## 2015-11-09 NOTE — Patient Instructions (Signed)
Based on your symptoms and your exam I believe you still have obstructive sleep apnea or OSA, and I think we should proceed with a sleep study to determine whether you do or do not have OSA and how severe it is. If you have more than mild OSA, I want you to consider treatment with CPAP. Please remember, the risks and ramifications of moderate to severe obstructive sleep apnea or OSA are: Cardiovascular disease, including congestive heart failure, stroke, difficult to control hypertension, arrhythmias, and even type 2 diabetes has been linked to untreated OSA. Sleep apnea causes disruption of sleep and sleep deprivation in most cases, which, in turn, can cause recurrent headaches, problems with memory, mood, concentration, focus, and vigilance. Most people with untreated sleep apnea report excessive daytime sleepiness, which can affect their ability to drive. Please do not drive if you feel sleepy.   I will likely see you back after your sleep study to go over the test results and where to go from there. We will call you after your sleep study to advise about the results (most likely, you will hear from Beverlee Nims, my nurse) and to set up an appointment at the time, as necessary.    Our sleep lab administrative assistant, Arrie Aran will meet with you or call you to schedule your sleep study. If you don't hear back from her by next week please feel free to call her at [phone]. This is her direct line and please leave a message with your phone number to call back if you get the voicemail box. She will call back as soon as possible.

## 2015-12-23 ENCOUNTER — Ambulatory Visit (INDEPENDENT_AMBULATORY_CARE_PROVIDER_SITE_OTHER): Payer: BC Managed Care – PPO | Admitting: Neurology

## 2015-12-23 DIAGNOSIS — G4733 Obstructive sleep apnea (adult) (pediatric): Secondary | ICD-10-CM | POA: Diagnosis not present

## 2015-12-23 DIAGNOSIS — G4761 Periodic limb movement disorder: Secondary | ICD-10-CM

## 2015-12-23 DIAGNOSIS — G4720 Circadian rhythm sleep disorder, unspecified type: Secondary | ICD-10-CM

## 2015-12-23 DIAGNOSIS — Z9989 Dependence on other enabling machines and devices: Principal | ICD-10-CM

## 2016-01-04 ENCOUNTER — Telehealth: Payer: Self-pay | Admitting: Neurology

## 2016-01-04 DIAGNOSIS — G4733 Obstructive sleep apnea (adult) (pediatric): Secondary | ICD-10-CM

## 2016-01-04 NOTE — Progress Notes (Signed)
PATIENT'S NAME:  Dillon Taylor, Dillon Taylor DOB:      June 05, 1975      MR#:    [id number]     DATE OF RECORDING: 12/23/2015 REFERRING M.D.:  Shanon Rosser, PA-C Study Performed:  Split-Night Titration Study HISTORY:  40 year old right-handed gentleman with an underlying medical history of hyperlipidemia, kidney stone, arthritis and obesity, who was previously diagnosed with obstructive sleep apnea many years ago and placed on AutoPap therapy. He presents for re-evaluation and optimization of treatment.   CURRENT MEDICATIONS: Niaspan, Zocor, Restoril, Ultram, Ambien    PROCEDURE:  This is a multichannel digital polysomnogram utilizing the Somnostar 11.2 system.  Electrodes and sensors were applied and monitored per AASM Specifications.   EEG, EOG, Chin and Limb EMG, were sampled at 200 Hz.  ECG, Snore and Nasal Pressure, Thermal Airflow, Respiratory Effort, CPAP Flow and Pressure, Oximetry was sampled at 50 Hz. Digital video and audio were recorded.      BASELINE STUDY WITHOUT CPAP RESULTS:  Lights Out was at 22:17 and split start was at 00:35.  Total recording time (TRT) was 138.5, with a total sleep time (TST) of 93.5 minutes.   The patient's sleep latency was 45.5 minutes.  REM latency was n/a.  The sleep efficiency was 67.5 %, which is reduced.    SLEEP ARCHITECTURE: Wake after Sleep onset was 39.5 minutes with moderate sleep fragmentation noted, Stage N1 9.6%, Stage N2 90.4%, and there was absence of Stage N3 and Stage R (REM sleep).   RESPIRATORY ANALYSIS:  There were a total of 93 respiratory events:  62 obstructive apneas, 0 central apneas and4 mixed apneas with a total of 66 apneas and an apnea index (AI) of 42.4. There were 27 hypopneas with a hypopnea index of 17.3. The patient also had 0 respiratory event related arousals (RERAs).      The total APNEA/HYPOPNEA INDEX (AHI) was 59.7 and the total RESPIRATORY DISTURBANCE INDEX was 59.7.  0 events occurred in REM sleep and 58 events in NREM. The REM AHI  was 0, versus a non-REM AHI of 59.7. The patient spent 51% of total sleep time in the supine position. The supine AHI was 71.3 versus a non-supine AHI of 47.5.  OXYGEN SATURATION & C02:  The baseline 02 saturation was 96%, with the lowest being 84%. Time spent below 89% saturation equaled 4 minutes.   PERIODIC LIMB MOVEMENTS: (Baseline)   The patient had a total of 50 Periodic Limb Movements.  The Periodic Limb Movement (PLM) index was 32.1 and the PLM Arousal index was 1.9.   TITRATION STUDY WITH CPAP RESULTS:   CPAP was initiated at 5 cmH20 with heated humidity per AASM split night standards and pressure was advanced to 9 cmH20 because of hypopneas, apneas and desaturations.  At a PAP pressure of 0 cmH20, there was a reduction of the AHI to 0 per hour. O2 nadir was 94%, and non-supine REM was achieved on a pressure of 8 cm.   Lights Out was at 00:35 and Lights On at 05:10. Total recording time (TRT) was 275 minutes, with a total sleep time (TST) of 252 minutes. The patient's sleep latency was 11 minutes with 12.5 minutes of wake time after sleep onset. REM latency was 66.5 minutes.  The sleep efficiency was 91.6 %, much improved.    SLEEP ARCHITECTURE: Stage N1 4.%, Stage N2 80.4%, Stage N3 0% and Stage R (REM sleep) 15.7%.   RESPIRATORY ANALYSIS:  There was a total of 10 respiratory events: 1 obstructive  apneas, 0 central apneas and 1 mixed apneas with a total of 2 apneas and an apnea index (AI) of .5. There were 8 hypopneas with a hypopnea index of 1.9. The patient also had 0 respiratory event related arousals (RERAs).      The total APNEA/HYPOPNEA INDEX  (AHI) was 2.4 and the total RESPIRATORY DISTURBANCE INDEX was 2.4.  3 events occurred in REM sleep and 7 events in NREM. The REM AHI was 4.6, versus a non-REM AHI of 2.. The patient spent 46% of total sleep time in the supine position. The supine AHI was 2.6, versus a non-supine AHI of 2.2.  OXYGEN SATURATION & C02:  The baseline 02  saturation was 96%, with the lowest being 89%. Time spent below 89% saturation equaled 0 minutes.  Average End Tidal CO2 during sleep was 1 torr. The minium End Tidal CO2 during sleep was 0 torr which increased to a maximum of 1  torr during NREM and 1 torr during REM. The duration of sleep time spent at >50 torr was 0.   PERIODIC LIMB MOVEMENTS: (post-CPAP)   The patient had a total of 12 Periodic Limb Movements. The Periodic Limb Movement (PLM) index was 2.9 and the PLM Arousal index was .2    POLYSOMNOGRAPHY IMPRESSION :  1.  Obstructive sleep apnea 2.  Periodic limb movement disorder 3.  Dysfunctions associated with sleep stages or arousal from sleep     RECOMMENDATIONS: 1. This patient has moderate to severe obstructive sleep apnea and responded well on CPAP therapy. I will, therefore, start the patient on home CPAP treatment at a pressure of 9 cm via small FFM, with heated humidity. Of note, the absence of REM sleep during the baseline portion of the study likely underestimates his AHI and desaturation nadir. The patient should be reminded to be fully compliant with PAP therapy to improve sleep related symptoms and decrease long term cardiovascular risks. Please note that untreated obstructive sleep apnea carries additional perioperative morbidity. Patients with significant obstructive sleep apnea should receive perioperative PAP therapy and the surgeons and particularly the anesthesiologist should be informed of the diagnosis and the severity of the sleep disordered breathing. 2. The patient should be cautioned not to drive, work at heights, or operate dangerous or heavy equipment when tired or sleepy. Review and reiteration of good sleep hygiene measures should be pursued with any patient. Weight loss should be promoted.  3. Moderate PLMs (periodic limb movements of sleep) were noted during the baseline portion of the study only and without significant arousals; clinical correlation is  recommended. 4. This study shows sleep fragmentation and abnormal sleep stage percentages; these are nonspecific findings and per se do not signify an intrinsic sleep disorder or a cause for the patient's sleep-related symptoms. Causes include (but are not limited to) the first night effect of the sleep study, circadian rhythm disturbances, medication effect or an underlying mood disorder or medical problem.  5. A follow up appointment will be scheduled in the Sleep Clinic at [facility]. The referring provider will be notified of the test results.    I certify that I have reviewed the entire raw data recording prior to the issuance of this report in accordance with the Standards of Accreditation of the American Academy of Sleep Medicine (AASM)       Star Age, MD, PhD Diplomat, American Board of Psychiatry and Neurology  Diplomat, [place] [employer]

## 2016-01-04 NOTE — Telephone Encounter (Signed)
Dillon Taylor:  Patient referred by primary provider, seen by me on 11/09/15, split study on 12/23/15. Please call and notify patient that the recent sleep study confirmed the diagnosis of severe OSA. He did well with CPAP during the study with significant improvement of the respiratory events. Therefore, I would like start the patient on CPAP therapy at home by prescribing a machine for home use.   He has an older AutoPAP machine, I am not sure, if he has a DME.  I placed the order in the chart. The patient will need a follow up appointment with me in 8 to 10 weeks post set up that has to be scheduled; please go ahead and schedule while you have the patient on the phone and make sure patient understands the importance of keeping this window for the FU appointment, as it is often an insurance requirement and failing to adhere to this may result in losing coverage for sleep apnea treatment.  Please re-enforce the importance of compliance with treatment and the need for [place] to monitor compliance data - again an insurance requirement and good feedback for the patient as far as how they are doing.  Also remind patient, that any upcoming CPAP machine or mask issues, should be first addressed with the DME company. Please ask if patient has a preference regarding DME company.  Please arrange for CPAP set up at home through a DME company of patient's choice - once you have spoken to the patient - and faxed/routed report to PCP and referring MD (if other than PCP), you can close this encounter, thanks,   Star Age, MD, PhD [facility] ([place])

## 2016-01-05 NOTE — Telephone Encounter (Signed)
I spoke with patient, he is aware of results and recommendations. He is willing to start CPAP treatment. I will send orders to [facility]. I have sent report to PCP. Patient will receive a letter reminding him to make appt and stress the importance of compliance.

## 2018-04-23 ENCOUNTER — Other Ambulatory Visit (HOSPITAL_BASED_OUTPATIENT_CLINIC_OR_DEPARTMENT_OTHER): Payer: Self-pay

## 2018-04-23 DIAGNOSIS — G4733 Obstructive sleep apnea (adult) (pediatric): Secondary | ICD-10-CM

## 2018-05-12 ENCOUNTER — Ambulatory Visit (HOSPITAL_BASED_OUTPATIENT_CLINIC_OR_DEPARTMENT_OTHER): Payer: BLUE CROSS/BLUE SHIELD | Attending: Physician Assistant | Admitting: Internal Medicine

## 2018-05-12 DIAGNOSIS — G4733 Obstructive sleep apnea (adult) (pediatric): Secondary | ICD-10-CM | POA: Diagnosis not present

## 2018-05-17 DIAGNOSIS — G4733 Obstructive sleep apnea (adult) (pediatric): Secondary | ICD-10-CM

## 2018-05-17 NOTE — Procedures (Signed)
    Patient Name: Dillon Taylor, Dillon Taylor Date: 05/12/2018 Gender: Male D.O.B: June 24, 1975 Age (years): 42 Referring Provider: Nicki Reaper Long PA-C Height (inches): 71 Interpreting Physician: Baird Lyons MD, ABSM Weight (lbs): 247 RPSGT: Jacolyn Reedy BMI: 34 MRN: [id number] Neck Size: 17.50  CLINICAL INFORMATION Sleep Study Type: HST Indication for sleep study: OSA Epworth Sleepiness Score: 2  SLEEP STUDY TECHNIQUE A multi-channel overnight portable sleep study was performed. The channels recorded were: nasal airflow, thoracic respiratory movement, and oxygen saturation with a pulse oximetry. Snoring was also monitored.  MEDICATIONS Patient self administered medications include: N/A.  SLEEP ARCHITECTURE Patient was studied for 336.8 minutes. The sleep efficiency was 91.8 % and the patient was supine for 32%. The arousal index was 0.0 per hour.  RESPIRATORY PARAMETERS The overall AHI was 85.7 per hour, with a central apnea index of 0.0 per hour. The oxygen nadir was 80% during sleep.  CARDIAC DATA Mean heart rate during sleep was 85.4 bpm.  IMPRESSIONS - Severe obstructive sleep apnea occurred during this study (AHI = 85.7/h). - No significant central sleep apnea occurred during this study (CAI = 0.0/h). - Oxygen desaturation was noted during this study (Min O2 = 80%). Mean 94%. - Patient snored.  DIAGNOSIS - Obstructive Sleep Apnea (327.23 [G47.33 ICD-10]) - Nocturnal Hypoxemia (327.26 [G47.36 ICD-10])  RECOMMENDATIONS - Suggest CPAP titration sleep study or autopap. Other options would be based on clinical judgment. - Be careful with alcohol, sedatives and other CNS depressants that may worsen sleep apnea and disrupt normal sleep architecture. - Sleep hygiene should be reviewed to assess factors that may improve sleep quality. - Weight management and regular exercise should be initiated or continued.  [Electronically signed] 05/17/2018 11:34 AM  Baird Lyons MD,  ABSM Diplomate, American Board of Sleep Medicine   NPI: [id number]                         [place] [place]  ELECTRONICALLY SIGNED ON:  05/17/2018, 11:32 AM [place] PH: [phone]   FX: [phone] [place]
